# Patient Record
Sex: Female | Born: 1950 | Hispanic: No | Marital: Married | Smoking: Former smoker
Health system: Southern US, Community
[De-identification: ages and names within clinical notes are randomized; demographics above are authoritative.]

## PROBLEM LIST (undated history)

## (undated) DIAGNOSIS — T7840XA Allergy, unspecified, initial encounter: Secondary | ICD-10-CM

## (undated) DIAGNOSIS — K219 Gastro-esophageal reflux disease without esophagitis: Secondary | ICD-10-CM

## (undated) HISTORY — DX: Gastro-esophageal reflux disease without esophagitis: K21.9

## (undated) HISTORY — PX: OOPHORECTOMY: SHX86

## (undated) HISTORY — DX: Allergy, unspecified, initial encounter: T78.40XA

---

## 2007-06-04 ENCOUNTER — Encounter (INDEPENDENT_AMBULATORY_CARE_PROVIDER_SITE_OTHER): Payer: Self-pay | Admitting: Internal Medicine

## 2007-06-04 ENCOUNTER — Observation Stay (HOSPITAL_COMMUNITY): Admission: EM | Admit: 2007-06-04 | Discharge: 2007-06-05 | Payer: Self-pay | Admitting: Emergency Medicine

## 2007-06-04 ENCOUNTER — Ambulatory Visit: Payer: Self-pay | Admitting: Internal Medicine

## 2007-06-04 ENCOUNTER — Ambulatory Visit: Payer: Self-pay | Admitting: Surgery

## 2009-03-10 IMAGING — CR DG CHEST 1V PORT
1 series · 1 of 1 positions shown · non-contrast
Comparison: none

CLINICAL DATA: Chest pain

Portable chest at 1511:
No previous for comparison. The heart size and mediastinal contours are within
normal limits.  Both lungs are clear.  The visualized skeletal structures are
unremarkable.

[AP]
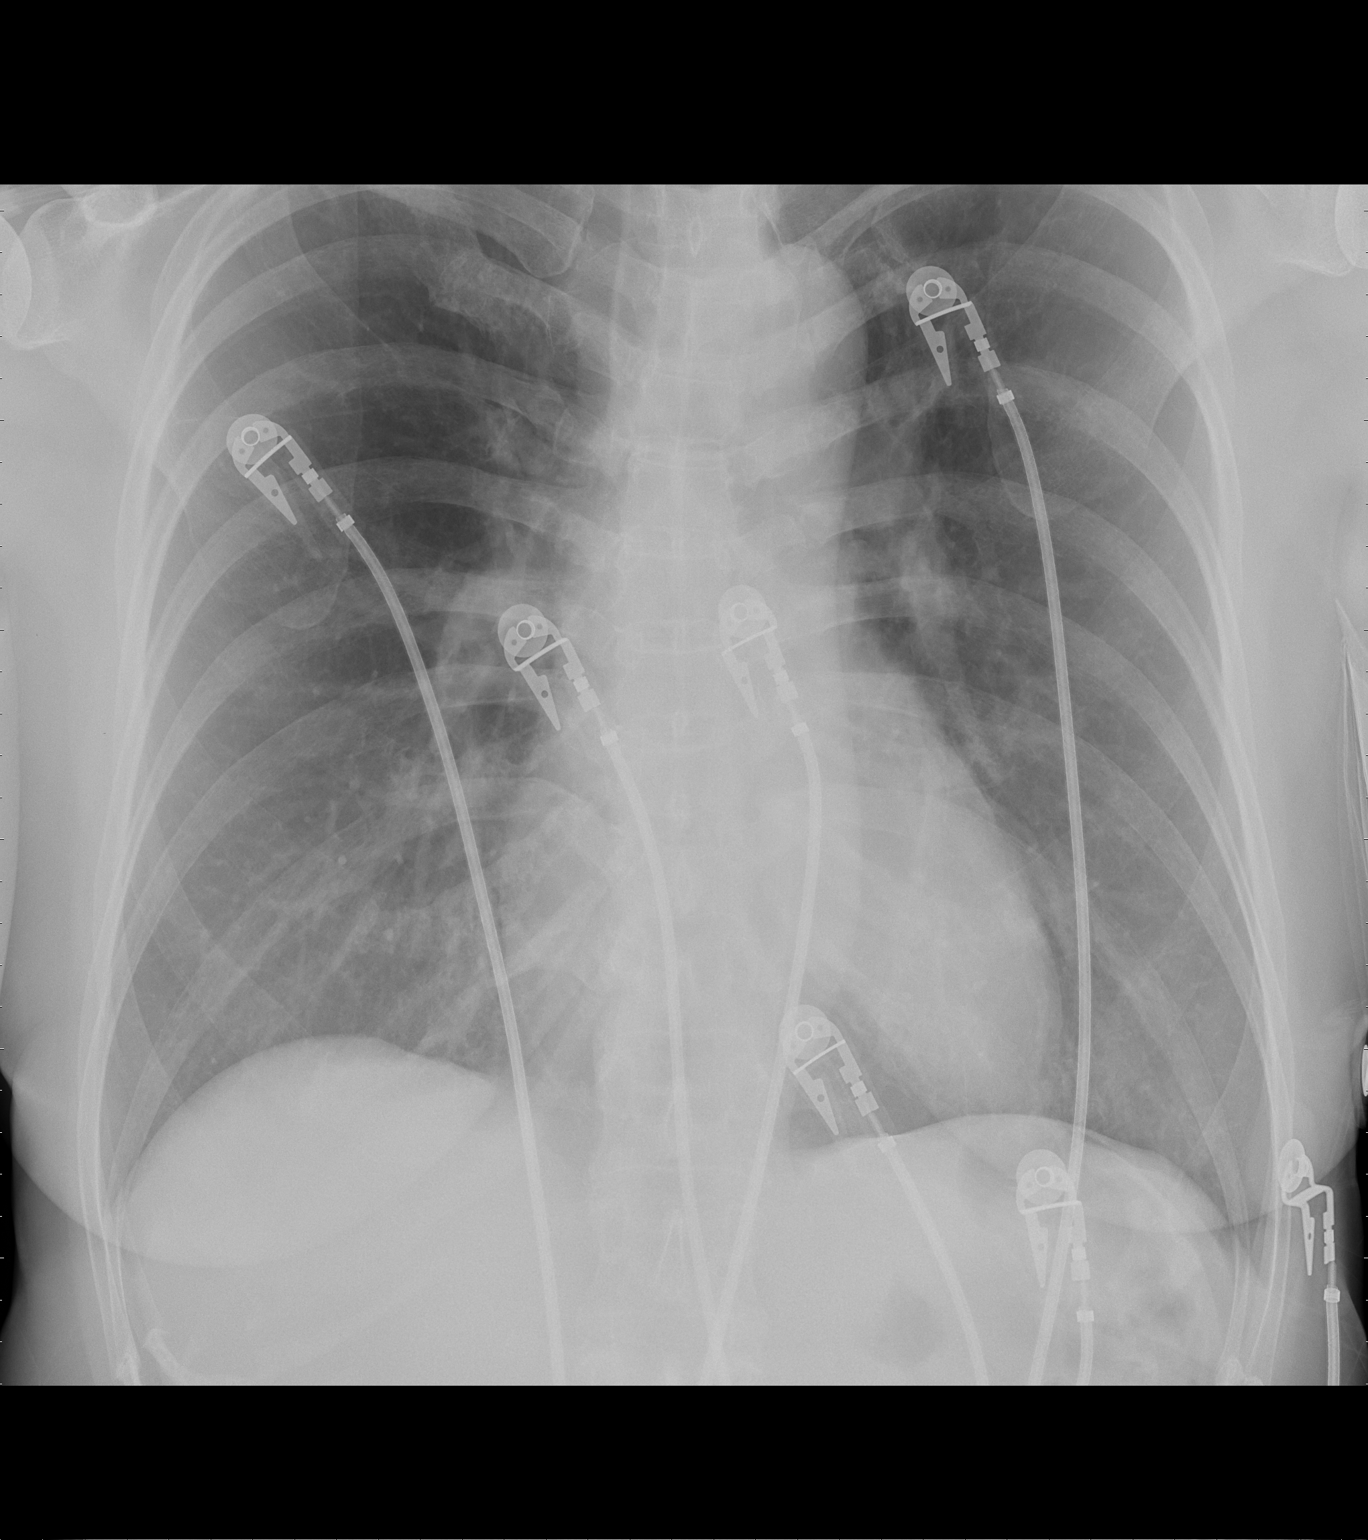

[1 of 1 positions shown; findings below may reference images not displayed]

IMPRESSION: 1. No active cardiopulmonary disease.

## 2010-08-05 NOTE — Consult Note (Signed)
NAME:  Kristen Young, Kristen Young NO.:  [id number]   MEDICAL RECORD NO.:  [id number]          PATIENT TYPE:  OBV   LOCATION:  3711                         FACILITY:  [facility]   PHYSICIAN:  Corky Crafts, MDDATE OF BIRTH:  [birth year]   DATE OF CONSULTATION:  DATE OF DISCHARGE:  06/05/2007                                 CONSULTATION   REFERRING PHYSICIAN:  Madaline Guthrie, M.D.   REASON FOR CONSULTATION:  Chest pain.   HISTORY OF PRESENT ILLNESS:  The patient is a 60 year old who is having  chest pain yesterday, while carrying objects at work.  It lasted about 2  hours.  She went to Urgent Care.  She got a nitroglycerin tablet under  her tongue and an aspirin, and it was relieved.  She had some more chest  pain last night while in the hospital, which was different in nature.  She did again take some nitroglycerin.  She exercises regularly in the  morning at home.  She does not have any chest pain with that.  Her pain  that she had this morning was worse with inspiration.  The most recent  pain was in her epigastric area.  She had some shortness of breath with  the initial episode of chest pain, but since then has not had any  problems.   PAST MEDICAL HISTORY:  1. GERD.  2. Seasonal allergies.  3. Migraines.   PAST SURGICAL HISTORY:  Ovarian surgery.   ALLERGIES:  NO KNOWN DRUG ALLERGIES.   SOCIAL HISTORY:  She smoked in the past.  She occasionally drinks  alcohol.  She works as a Child psychotherapist.  She lives with her husband and  daughter.   FAMILY HISTORY:  Mother died of an MI at age 11, her brother had a CVA  at age 80.   REVIEW OF SYSTEMS:  No fevers, chills, no weight loss, no dyspnea on  exertion, no palpitations, no bleeding problems, chest pain or shortness  of breath, as described as above well.  All other systems negative.   PHYSICAL EXAM:  VITAL SIGNS:  Blood pressure is 114/70, pulse 85.  GENERAL:  She is awake, alert, no apparent distress.  HEENT:  HEAD:   Normocephalic, atraumatic.  EYES:  Extraocular is intact.  NECK:  No JVD.  CARDIOVASCULAR:  Regular rate and rhythm, S1-S2.  LUNGS:  Clear to auscultation bilaterally.  ABDOMEN:  Mild epigastric tenderness with palpation.  No rebound, no  guarding.  EXTREMITIES:  Showed no edema.  NEURO:  No focal deficits.  Normal gait.  SKIN:  No rash.  BACK:  No kyphosis.   ECG showed normal sinus rhythm with no pathologic Q-waves, nonspecific  ST-T wave changes in the anterior precordium.   MEDICAL DECISION-MAKING:  A 60 year old with atypical chest pain.   PLAN:  She is ruled out for MI.  I would perform a stress test.  The  patient wants to go home and have the test as an outpatient.  This is  reasonable.  In the interim, I have instructed her to take her Zantac  regularly, will give her a prescription for  sublingual nitroglycerin as  needed.  I will have our office contact her with a time for her stress  test tomorrow.      Corky Crafts, MD  Electronically Signed     JSV/MEDQ  D:  06/05/2007  T:  06/05/2007  Job:  [id number]

## 2010-08-08 NOTE — Discharge Summary (Signed)
NAME:  Kristen Young, Kristen Young             ACCOUNT NO.:  [id number]   MEDICAL RECORD NO.:  [id number]          PATIENT TYPE:  OBV   LOCATION:  3711                         FACILITY:  [facility]   PHYSICIAN:  Madaline Guthrie, M.D.    DATE OF BIRTH:  May 02, 1950   DATE OF ADMISSION:  06/04/2007  DATE OF DISCHARGE:  06/05/2007                               DISCHARGE SUMMARY   DISCHARGE DIAGNOSES:  1. Chest pain.  2. Left lower extremity pain.  3. Gastroesophageal reflux disease.  4. Seasonal allergies.   DISCHARGE MEDICATIONS:  1. Nitroglycerin 0.4 mg sublingual q.5 minutes x3 p.r.n. for chest      pain.  2. Women's One A Day.  3. Motrin 400 mg q.8 hours p.r.n.  4. Zantac 150 mg daily.   DISPOSITION:  The patient is to follow up with Corky Crafts, M.D.  in cardiology for a possible stress test.  The patient is also to follow  up with Joaquin Courts, M.D. in the Outpatient Clinic and both  physicians offices will call her with those appointments.   PROCEDURE:  No procedures were performed during this hospitalization.   CONSULTATIONS:  Cardiology was consulted and Dr. Eldridge Dace saw the  patient and recommended that she follow up in his office as an  outpatient secondary to chest pain.   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  She described the pain as  pressure-like and it radiated toward her biceps in her left arm.  It was  not associated with nausea, vomiting, or diaphoresis, and was not  relieved by rest.  The pain was relieved with nitroglycerin in the  emergency room.  She denies any fevers, chills, and cough.  She notes  that she recently traveled to the [place] a [place] ago and also has  some left lower extremity pain that is chronic, but has been worsening  lately.   She denies any history of diabetes, hypertension,  hyperlipidemia, premature family history of coronary artery disease.   ADMISSION VITALS:  Temperature 98.4, blood pressure 134/67, pulse 61,  respiratory rate 22, O2 saturation 97% on room air.   PHYSICAL EXAMINATION:  GENERAL:  She is alert and pleasant, well  dressed, and in no distress.  HEENT:  Eyes; anicteric.  ENT; moist mucous membranes.  NECK:  No JVD, no lymphadenopathy.  LUNGS:  Clear to auscultation bilaterally with a normal respiratory  effort and good air movement.  CARDIOVASCULAR:  Regular rate and rhythm, no murmurs, rubs, or gallops.  ABDOMEN:  Good bowel sounds, soft, nontender, and nondistended with a  well-healed vertical incision.  EXTREMITIES:  No edema, palpable cords, or erythema with 2+ dorsalis  pedis pulses.  Homan's sign positive.  SKIN:  Warm with no rashes.  MUSCULOSKELETAL:  Strength 5/5.  NEUROLOGY:  No focal deficits.   ADMISSION LABS:  Sodium 132, potassium 3.6, chloride 98, bicarb 27, BUN  10, creatinine  0.55, glucose 95.  LFTs all within normal limits.  Calcium 8.9, white count 7.2 with an absolute neutrophil count of 3.6,  hemoglobin 13.3 with MCV 88, platelets 275.  Cardiac enzymes with point  of care marker CK-MB less than 1, troponin less than 0.05, myoglobin 43.  Lipase 28.  D-dimer less than 0.22.  Urine drug screen negative.  TSH  0.939.  Hemoglobin A1C 6.0.   Chest x-ray; no active cardiopulmonary disease.   EKG;  T wave inversion in V1 and V3 and some flattening in V2.  Normal  sinus rhythm.  No ST segment elevation or depression.   HOSPITAL COURSE:  Problem 1.  Chest pain.  The patient was monitored on  telemetry without any arrhythmias.  Her cardiac enzymes were cycled and  were all well within normal limits.  EKGs were cycled and there were no  changes in her T wave inversion in V1 and V3 and flattening in V2.  Given the patient only risk factor for coronary artery disease is  remote  smoking history of approximately less than a pack a day for five years,  it was felt like she could go home safely and this pain was likely not  cardiac in etiology.  However, cardiology was consulted and saw the  patient and recommended possible outpatient stress test and also  recommended the patient to take her Zantac regularly.  The patient's  hemoglobin A1C was checked and was within normal limits.  A D-dimer was  checked and was well within normal limits and a TSH was checked and was  within normal limits.  The patient was given sublingual nitroglycerin to  take for pain relief and if her pain got any worse was instructed to  return to the emergency room.   Problem 2.  Left lower extremity pain.  The patient reported significant  left lower extremity pain and given her history of a very long flight  approximately a month ago, a left lower extremity Doppler was done and  showed no evidence of DVT.  The patient continued to have some left  lower extremity pain during this hospitalization that was relieved with  low dose of ibuprofen and she will continue this as an outpatient.  She  was not taking significant doses of NSAIDS as an outpatient and so it  was not felt like her NSAIDS ingestion was a problem and could relate to  #1.   Problem 3.  GERD.  It was unclear whether her chest pain was related to  her GERD, but PPI therapy was started while inpatient and she will  continue to take her Zantac as an outpatient regularly.   DISCHARGE LABORATORY DATA:  Lipid panel; total cholesterol 163, HDL 53,  LDL 83, triglycerides 136.  BMET; sodium 140, potassium 4.1, chloride  105, bicarb 26, BUN 12, creatinine 0.68, glucose 90, calcium 9.1.  CBC;  white count 6.3, hemoglobin 12.8, MCV 88, and platelets 257.  TSH 0.939.  Hemoglobin A1C 6.0. Urine drug screen negative.  D-dimer less than 0.22.   DISCHARGE VITALS:  Temperature 97.9, blood pressure 123/80, pulse 94,  respiratory rate  18, O2 saturation 98% on room air.      Joaquin Courts, MD  Electronically Signed      Madaline Guthrie, M.D.  Electronically Signed    VW/MEDQ  D:  06/07/2007  T:  06/07/2007  Job:  [id number]   cc:   Corky Crafts, MD

## 2010-12-15 LAB — CARDIAC PANEL(CRET KIN+CKTOT+MB+TROPI)
CK, MB: 0.9
CK, MB: 1.1
Relative Index: 0.8
Total CK: 143
Troponin I: <0.01

## 2010-12-15 LAB — BASIC METABOLIC PANEL
Calcium: 9.1
Creatinine, Ser: 0.68
GFR calc Af Amer: >60
GFR calc non Af Amer: >60
Glucose, Bld: 90
Sodium: 140

## 2010-12-15 LAB — POCT CARDIAC MARKERS
CKMB, poc: <1 — ABNORMAL LOW
CKMB, poc: <1 — ABNORMAL LOW
Myoglobin, poc: 43.7
Myoglobin, poc: 47.8
Operator id: 294521
Troponin i, poc: <0.05
Troponin i, poc: <0.05

## 2010-12-15 LAB — COMPREHENSIVE METABOLIC PANEL
Albumin: 3.8
Alkaline Phosphatase: 61
BUN: 10
Calcium: 8.9
Potassium: 3.6
Total Protein: 6.9

## 2010-12-15 LAB — CBC
HCT: 39.5
Hemoglobin: 12.8
Platelets: 275
RBC: 4.29
RDW: 14.3
RDW: 14

## 2010-12-15 LAB — DIFFERENTIAL
Basophils Relative: 1
Lymphocytes Relative: 34
Lymphs Abs: 2.5
Monocytes Absolute: 0.6
Monocytes Relative: 9
Neutro Abs: 3.6

## 2010-12-15 LAB — TSH: TSH: 0.939

## 2010-12-15 LAB — LIPID PANEL
Cholesterol: 163
LDL Cholesterol: 83
Triglycerides: 136
VLDL: 27

## 2010-12-15 LAB — CK TOTAL AND CKMB (NOT AT ARMC)
CK, MB: 1.3
Total CK: 157

## 2010-12-15 LAB — RAPID URINE DRUG SCREEN, HOSP PERFORMED: Barbiturates: NOT DETECTED

## 2015-06-25 ENCOUNTER — Telehealth: Payer: Self-pay

## 2015-06-25 NOTE — Telephone Encounter (Signed)
Called patient to review information for chart and reming of scheduled appointment for tomorrow. Left message for return call.

## 2015-06-26 ENCOUNTER — Encounter: Payer: Self-pay | Admitting: Medical

## 2015-06-26 ENCOUNTER — Ambulatory Visit (HOSPITAL_BASED_OUTPATIENT_CLINIC_OR_DEPARTMENT_OTHER)
Admission: RE | Admit: 2015-06-26 | Discharge: 2015-06-26 | Disposition: A | Discharge status: Home / Self Care | Payer: Commercial Managed Care - PPO | Source: Ambulatory Visit | Attending: Medical | Admitting: Medical

## 2015-06-26 ENCOUNTER — Ambulatory Visit (INDEPENDENT_AMBULATORY_CARE_PROVIDER_SITE_OTHER): Payer: Commercial Managed Care - PPO | Admitting: Medical

## 2015-06-26 VITALS — BP 118/78 | HR 87 | Temp 98.1°F | Ht 60.0 in | Wt 128.2 lb

## 2015-06-26 DIAGNOSIS — M545 Low back pain: Secondary | ICD-10-CM

## 2015-06-26 DIAGNOSIS — M47896 Other spondylosis, lumbar region: Secondary | ICD-10-CM | POA: Insufficient documentation

## 2015-06-26 DIAGNOSIS — M8580 Other specified disorders of bone density and structure, unspecified site: Secondary | ICD-10-CM

## 2015-06-26 MED ORDER — CYCLOBENZAPRINE HCL 5 MG PO TABS: 5.0000 mg | ORAL_TABLET | Freq: Every day | ORAL | Status: AC

## 2015-06-26 MED ORDER — LEVOCETIRIZINE DIHYDROCHLORIDE 5 MG PO TABS: 5.0000 mg | ORAL_TABLET | Freq: Every evening | ORAL | Status: DC

## 2015-06-26 MED ORDER — DICLOFENAC SODIUM 75 MG PO TBEC: 75.0000 mg | DELAYED_RELEASE_TABLET | Freq: Two times a day (BID) | ORAL | Status: AC

## 2015-06-26 NOTE — Progress Notes (Signed)
Pre visit review using our clinic review tool, if applicable. No additional management support is needed unless otherwise documented below in the visit note. 

## 2015-06-26 NOTE — Progress Notes (Signed)
Subjective:    Patient ID: Kristen Young, female    DOB: October 13, 1950, 65 y.o.   MRN: [id number]  HPI   I have reviewed pt PMH, PSH, FH, Social History and Surgical History  Works at [employer], pt does not exercise officially, no caffeine beverage, pt states eats healthy, married.  Pt in for first time. She has lower back pain. Pt has pain for 3-4 weeks. No fall or injury. Pain came on slowly. No history of back pain.  Hurts all the time to some degree. When stands will be more painful.   No pain radiating to legs. No saddle anesthesia. No incontinence.  Pt also has spring allergie. Pt uses benadryl.  Pt behind on routine maintenance. So advised to schedule CPE.  Review of Systems  Constitutional: Negative for fever, chills and fatigue.  HENT:       Spring allergies. Relativley controlled now.  Respiratory: Negative for cough, chest tightness, shortness of breath and wheezing.   Cardiovascular: Negative for chest pain and palpitations.  Gastrointestinal: Negative for abdominal pain.  Genitourinary: Negative for dysuria, frequency, flank pain and difficulty urinating.  Musculoskeletal: Positive for back pain. Negative for myalgias, joint swelling, arthralgias and neck stiffness.  Skin: Negative for rash.  Hematological: Negative for adenopathy. Does not bruise/bleed easily.  Psychiatric/Behavioral: Negative for behavioral problems and confusion.    No past medical history on file.  Social History   Social History  . Marital Status: Married    Spouse Name: N/A  . Number of Children: N/A  . Years of Education: N/A   Occupational History  . Not on file.   Social History Main Topics  . Smoking status: Never Smoker   . Smokeless tobacco: Never Used  . Alcohol Use: No  . Drug Use: No  . Sexual Activity: Not on file   Other Topics Concern  . Not on file   Social History Narrative  . No narrative on file    No past surgical history on file.  No family  history on file.  Allergies  Allergen Reactions  . Dextromethorphan-Guaifenesin   . Guaifenesin Rash    No current outpatient prescriptions on file prior to visit.   No current facility-administered medications on file prior to visit.    BP 118/78 mmHg  Pulse 87  Temp(Src) 98.1 F (36.7 C) (Oral)  Ht 5' (1.524 m)  Wt 128 lb 3.2 oz (58.151 kg)  BMI 25.04 kg/m2  SpO2 98%  LMP        Objective:   Physical Exam  General Appearance- Not in acute distress.    Chest and Lung Exam Auscultation: Breath sounds:-Normal. Clear even and unlabored. Adventitious sounds:- No Adventitious sounds.  Cardiovascular Auscultation:Rythm - Regular, rate and rythm. Heart Sounds -Normal heart sounds.  Abdomen Inspection:-Inspection Normal.  Palpation/Perucssion: Palpation and Percussion of the abdomen reveal- Non Tender, No Rebound tenderness, No rigidity(Guarding) and No Palpable abdominal masses.  Liver:-Normal.  Spleen:- Normal.   Back Mid lumbar spine tenderness to palpation. Pain on straight leg lift rt side leg lift. Pain on lateral movements and flexion/extension of the spine.  Lower ext neurologic  L5-S1 sensation intact bilaterally. Normal patellar reflexes bilaterally. No foot drop bilaterally.      Assessment & Plan:  For your back pain will get lumbar xray today. Back stretching exercises. If pain persist consider PT. If pain worsens or radiating consider MRI.  Rx diclofenac for pain.  Rx cyclobenzaprine low dose at night  For allergies  rx xyzal. Stop benadryl.  Follow up in 10-14 days or as needed

## 2015-06-26 NOTE — Patient Instructions (Addendum)
For your back pain will get lumbar xray today. Back stretching exercises. If pain persist consider PT. If pain worsens or radiating consider MRI.  Red flag signs and symptoms reviewed that would indicate ED evaluate.   Rx diclofenac for pain.  Rx cyclobenzaprine low dose at night  For allergies rx xyzal. Stop benadryl.  Follow up in 10-14 days or as needed.  Asked pt to schedule cpe since she has not had one done in very long time and need to update health maintenance.

## 2015-06-28 NOTE — Addendum Note (Signed)
Addended by: Neldon LabellaMABE, HOLDEN S on: 06/28/2015 02:19 PM   Modules accepted: Orders

## 2015-07-09 ENCOUNTER — Encounter: Payer: Self-pay | Admitting: Medical

## 2015-07-09 ENCOUNTER — Telehealth: Payer: Self-pay | Admitting: Medical

## 2015-07-09 ENCOUNTER — Ambulatory Visit (INDEPENDENT_AMBULATORY_CARE_PROVIDER_SITE_OTHER): Payer: Commercial Managed Care - PPO | Admitting: Medical

## 2015-07-09 VITALS — BP 122/80 | HR 67 | Temp 98.1°F | Ht 60.0 in | Wt 129.6 lb

## 2015-07-09 DIAGNOSIS — J309 Allergic rhinitis, unspecified: Secondary | ICD-10-CM | POA: Diagnosis not present

## 2015-07-09 DIAGNOSIS — J0100 Acute maxillary sinusitis, unspecified: Secondary | ICD-10-CM

## 2015-07-09 DIAGNOSIS — M545 Low back pain: Secondary | ICD-10-CM | POA: Diagnosis not present

## 2015-07-09 DIAGNOSIS — E559 Vitamin D deficiency, unspecified: Secondary | ICD-10-CM

## 2015-07-09 DIAGNOSIS — M8580 Other specified disorders of bone density and structure, unspecified site: Secondary | ICD-10-CM | POA: Diagnosis not present

## 2015-07-09 DIAGNOSIS — R062 Wheezing: Secondary | ICD-10-CM

## 2015-07-09 LAB — VITAMIN D 25 HYDROXY (VIT D DEFICIENCY, FRACTURES): VITD: 26.76 ng/mL — AB (ref 30.00–100.00)

## 2015-07-09 MED ORDER — CEFDINIR 300 MG PO CAPS: 300.0000 mg | ORAL_CAPSULE | Freq: Two times a day (BID) | ORAL | Status: AC

## 2015-07-09 MED ORDER — VITAMIN D (ERGOCALCIFEROL) 1.25 MG (50000 UNIT) PO CAPS: 50000.0000 [IU] | ORAL_CAPSULE | ORAL | Status: AC

## 2015-07-09 MED ORDER — FLUTICASONE PROPIONATE 50 MCG/ACT NA SUSP: 2.0000 | Freq: Every day | NASAL | Status: AC

## 2015-07-09 MED ORDER — BENZONATATE 100 MG PO CAPS: 100.0000 mg | ORAL_CAPSULE | Freq: Three times a day (TID) | ORAL | Status: AC | PRN

## 2015-07-09 MED ORDER — ALBUTEROL SULFATE HFA 108 (90 BASE) MCG/ACT IN AERS: 2.0000 | INHALATION_SPRAY | Freq: Four times a day (QID) | RESPIRATORY_TRACT | Status: AC | PRN

## 2015-07-09 NOTE — Progress Notes (Signed)
Pre visit review using our clinic review tool, if applicable. No additional management support is needed unless otherwise documented below in the visit note. 

## 2015-07-09 NOTE — Patient Instructions (Addendum)
For allergic rhinitis continue xyzal. Rx flonase. Will give depomedrol 80 mg im today due to severity of symptoms.  For mild transient wheeze. Will rx albuterol.  If sinus pressure persists despite the above then start cefdnir.  For osteopenia get vitamin D level today. Start calcium 1200 mg a day. Get dexascan when notified on when scheduled.  For low back pain can continue diclofenac on occasion.  Follow up in 7-10 days or as needed

## 2015-07-09 NOTE — Progress Notes (Signed)
Subjective:    Patient ID: Kristen Young, female    DOB: 1950/11/22, 65 y.o.   MRN: [id number]  HPI  Pt in for follow up on her back pain. Pt responded to diclofenac and flexeril. 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 Also some osteopenia. She took diclofenac and now has no pain.  Pt has order for vitamin D. She can get today. I also placed order for dexascan. Pt has not been contacted yet on date of scan.  Pt has some allergies. Some mild on last visit. I rx'd xyzal. Did not help. She has sneezing, itchy eyes, runny nose and pnd. Also some sinus pressure. Symptoms have gotten worse over last 2 weeks. Pt states [place] Exposed to a lot of pollen.      Review of Systems  Constitutional: Negative for fever, chills and fatigue.  HENT: Positive for congestion, postnasal drip, rhinorrhea, sinus pressure and sneezing. Negative for ear pain and mouth sores.   Eyes: Positive for itching.  Respiratory: Positive for wheezing. Negative for cough, chest tightness and shortness of breath.        Occasional wheezing.  Cardiovascular: Negative for chest pain and palpitations.  Gastrointestinal: Negative for abdominal pain.  Musculoskeletal: Negative for back pain.       See hpi.  Neurological: Negative for dizziness and headaches.  Hematological: Negative for adenopathy. Does not bruise/bleed easily.   Past Medical History  Diagnosis Date  . Allergy   . GERD (gastroesophageal reflux disease)      Social History   Social History  . Marital Status: Married    Spouse Name: N/A  . Number of Children: N/A  . Years of Education: N/A   Occupational History  . Not on file.   Social History Main Topics  . Smoking status: Former Smoker    Quit date: 06/25/1968  . Smokeless tobacco: Never Used  . Alcohol Use: 0.0 oz/week    0 Standard drinks or equivalent per week     Comment: occasional/rare cocktail.  . Drug Use: No  . Sexual Activity:  Not on file   Other Topics Concern  . Not on file   Social History Narrative    Past Surgical History  Procedure Laterality Date  . Oophorectomy      pt not sure which side.     Family History  Problem Relation Age of Onset  . Hypertension Mother   . Stroke Sister   . Stroke Brother   . Diabetes Brother     Allergies  Allergen Reactions  . Dextromethorphan-Guaifenesin   . Guaifenesin Rash    Current Outpatient Prescriptions on File Prior to Visit  Medication Sig Dispense Refill  . cyclobenzaprine (FLEXERIL) 5 MG tablet Take 1 tablet (5 mg total) by mouth at bedtime. 30 tablet 0  . diclofenac (VOLTAREN) 75 MG EC tablet Take 1 tablet (75 mg total) by mouth 2 (two) times daily. 20 tablet 0   No current facility-administered medications on file prior to visit.    BP 122/80 mmHg  Pulse 67  Temp(Src) 98.1 F (36.7 C) (Oral)  Ht 5' (1.524 m)  Wt 129 lb 9.6 oz (58.786 kg)  BMI 25.31 kg/m2  SpO2 98%       Objective:   Physical Exam  General  Mental Status - Alert. General Appearance - Well groomed. Not in acute distress.  Skin Rashes- No Rashes.  HEENT Head- Normal. Ear Auditory  Canal - Left- Normal. Right - Normal.Tympanic Membrane- Left- Normal. Right- Normal. Eye Sclera/Conjunctiva- both eyes red. Lower lids mild inflamed. Nose & Sinuses Nasal Mucosa- Left-  Boggy and Congested. Right-  Boggy and  Congested.Bilateral maxillary and frontal sinus pressure. Mouth & Throat Lips: Upper Lip- Normal: no dryness, cracking, pallor, cyanosis, or vesicular eruption. Lower Lip-Normal: no dryness, cracking, pallor, cyanosis or vesicular eruption. Buccal Mucosa- Bilateral- No Aphthous ulcers. Oropharynx- No Discharge or Erythema. +pnd Tonsils: Characteristics- Bilateral- No Erythema or Congestion. Size/Enlargement- Bilateral- No enlargement. Discharge- bilateral-None.  Neck Neck- Supple. No Masses.   Chest and Lung Exam Auscultation: Breath Sounds:-Clear even  and unlabored. Only faint occasional expiratory wheeze  Cardiovascular Auscultation:Rythm- Regular, rate and rhythm. Murmurs & Other Heart Sounds:Ausculatation of the heart reveal- No Murmurs.  Lymphatic Head & Neck General Head & Neck Lymphatics: Bilateral: Description- No Localized lymphadenopathy.  Back Mid lumbar spine tenderness to palpation. Pain on straight leg lift. Pain on lateral movements and flexion/extension of the spine.  Lower ext neurologic  L5-S1 sensation intact bilaterally. Normal patellar reflexes bilaterally. No foot drop bilaterally.      Assessment & Plan:  For allergic rhinitis continue xyzal. Rx flonase. Will give depmedrol 80 mg im today due to severity of symptoms.  If sinus pressure persists despite the above then start cefdnir.  For osteopenia get vitamin D level today. Start calcium 1200 mg a day. Get dexascan when notified on when scheduled.  For low back pain can continue diclofenac on occasion.  Follow up in 7-10 days or as needed

## 2015-07-09 NOTE — Telephone Encounter (Signed)
Pt vitamin d sent to pharmacy.

## 2015-07-10 NOTE — Addendum Note (Signed)
Addended by: Neldon LabellaMABE, HOLDEN S on: 07/10/2015 09:54 AM   Modules accepted: Orders

## 2017-04-01 IMAGING — DX DG LUMBAR SPINE 2-3V
3 series · 3 of 3 positions shown · non-contrast
Comparison: No recent prior.

CLINICAL DATA: Low back pain.  Sciatica.

EXAM:
LUMBAR SPINE - 2-3 VIEW

[l-spine ap]
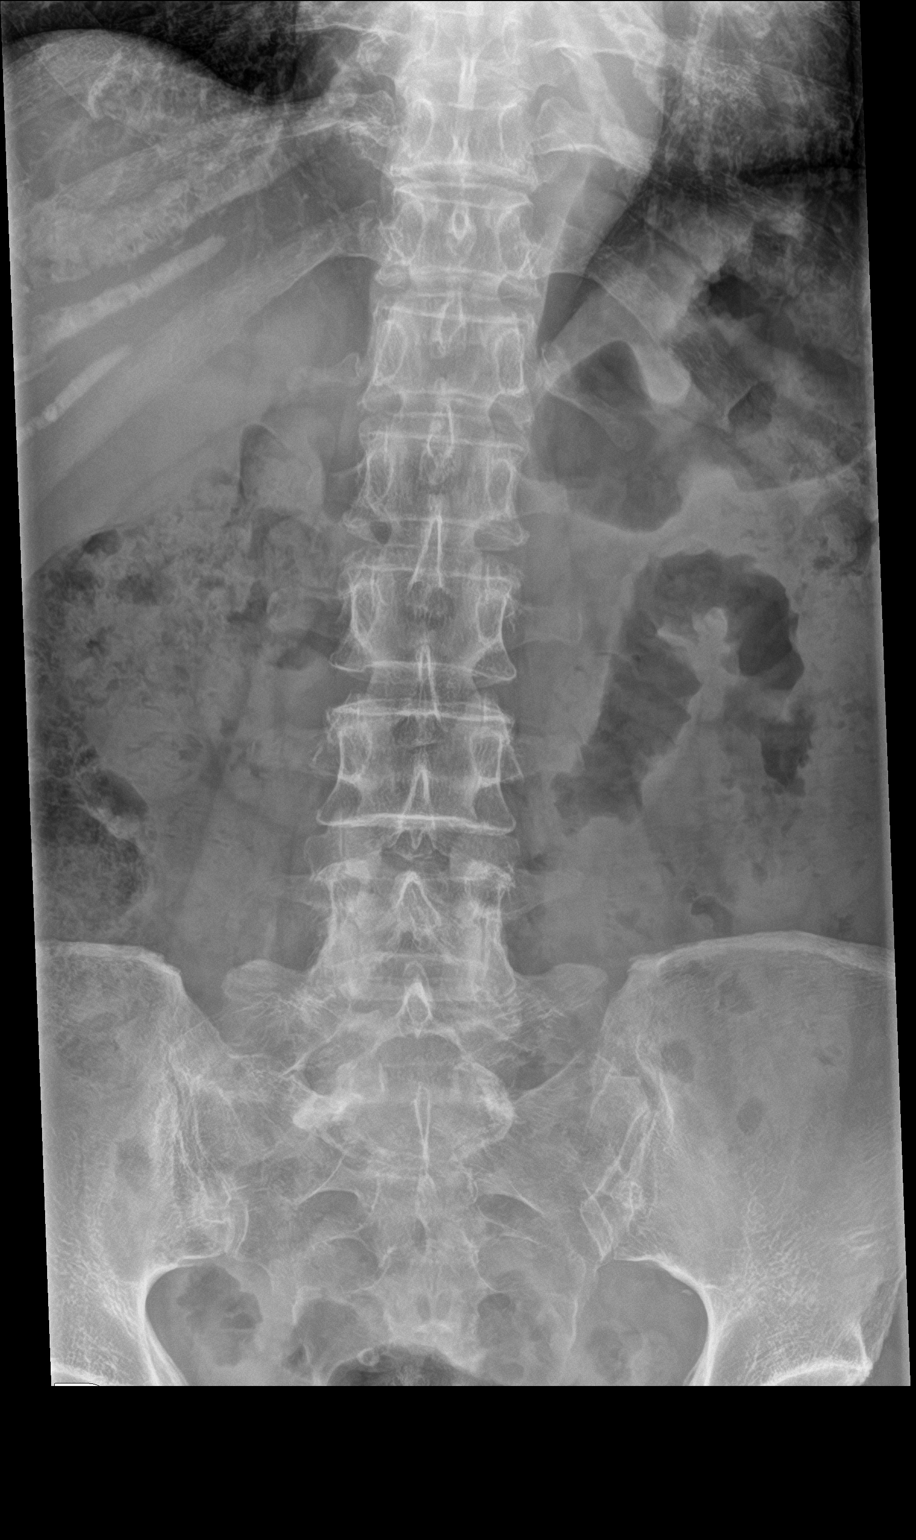

[l-spine lat]
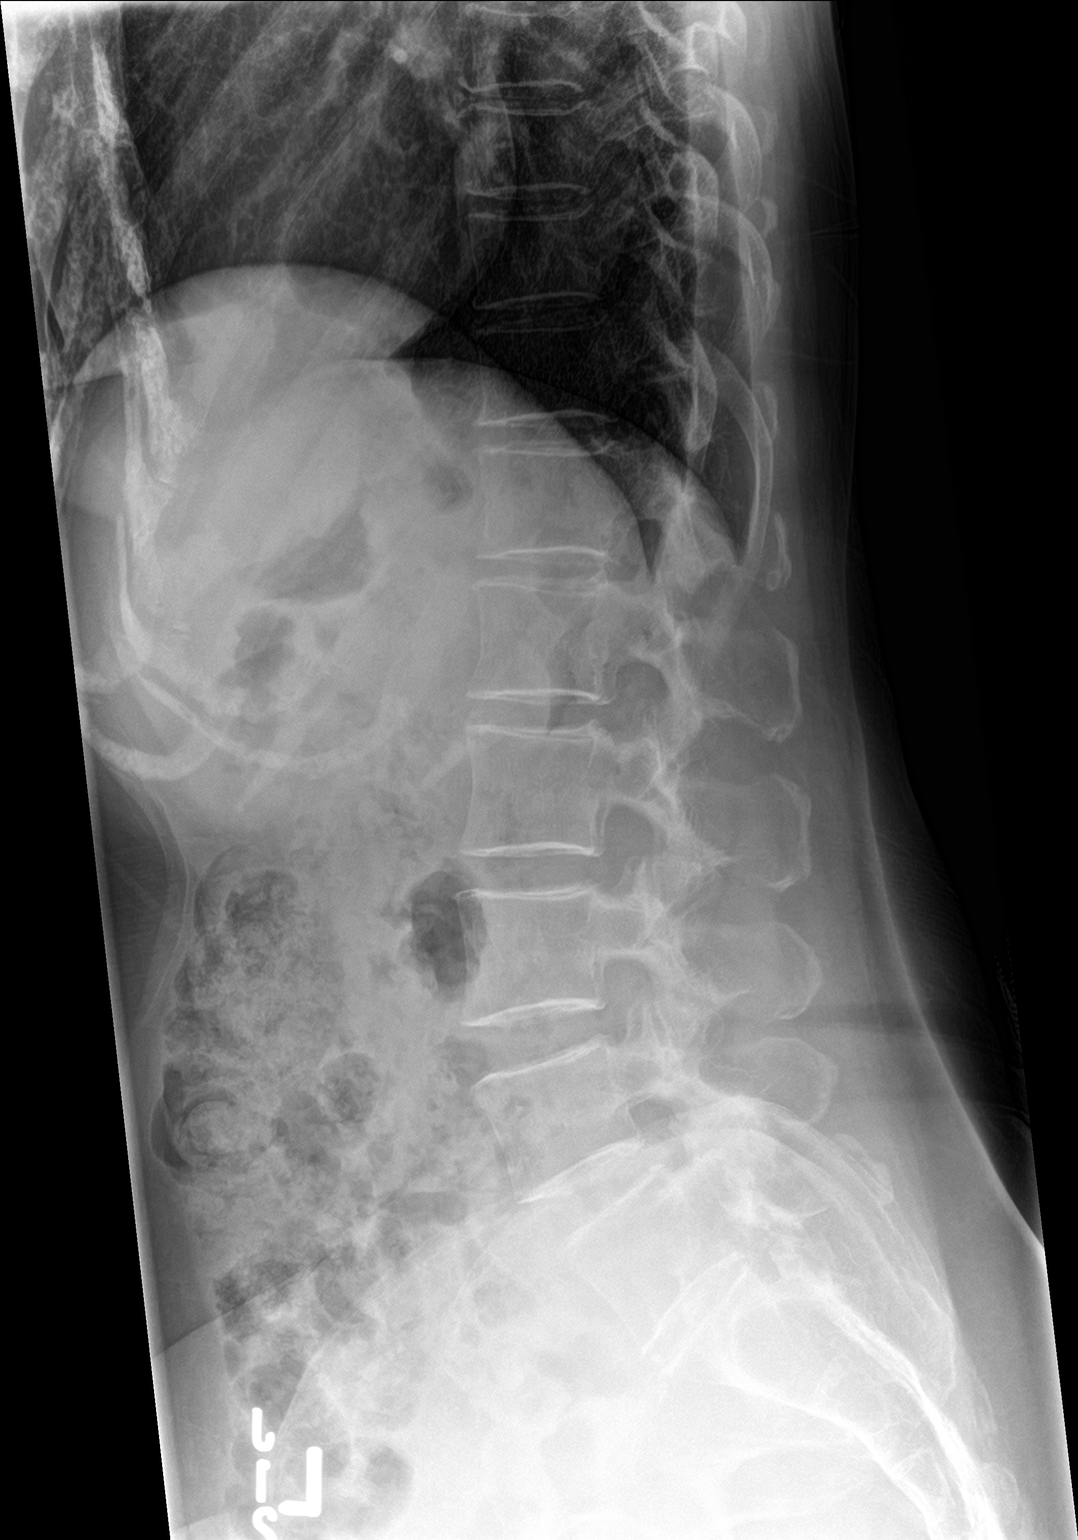

[l-spine spot]
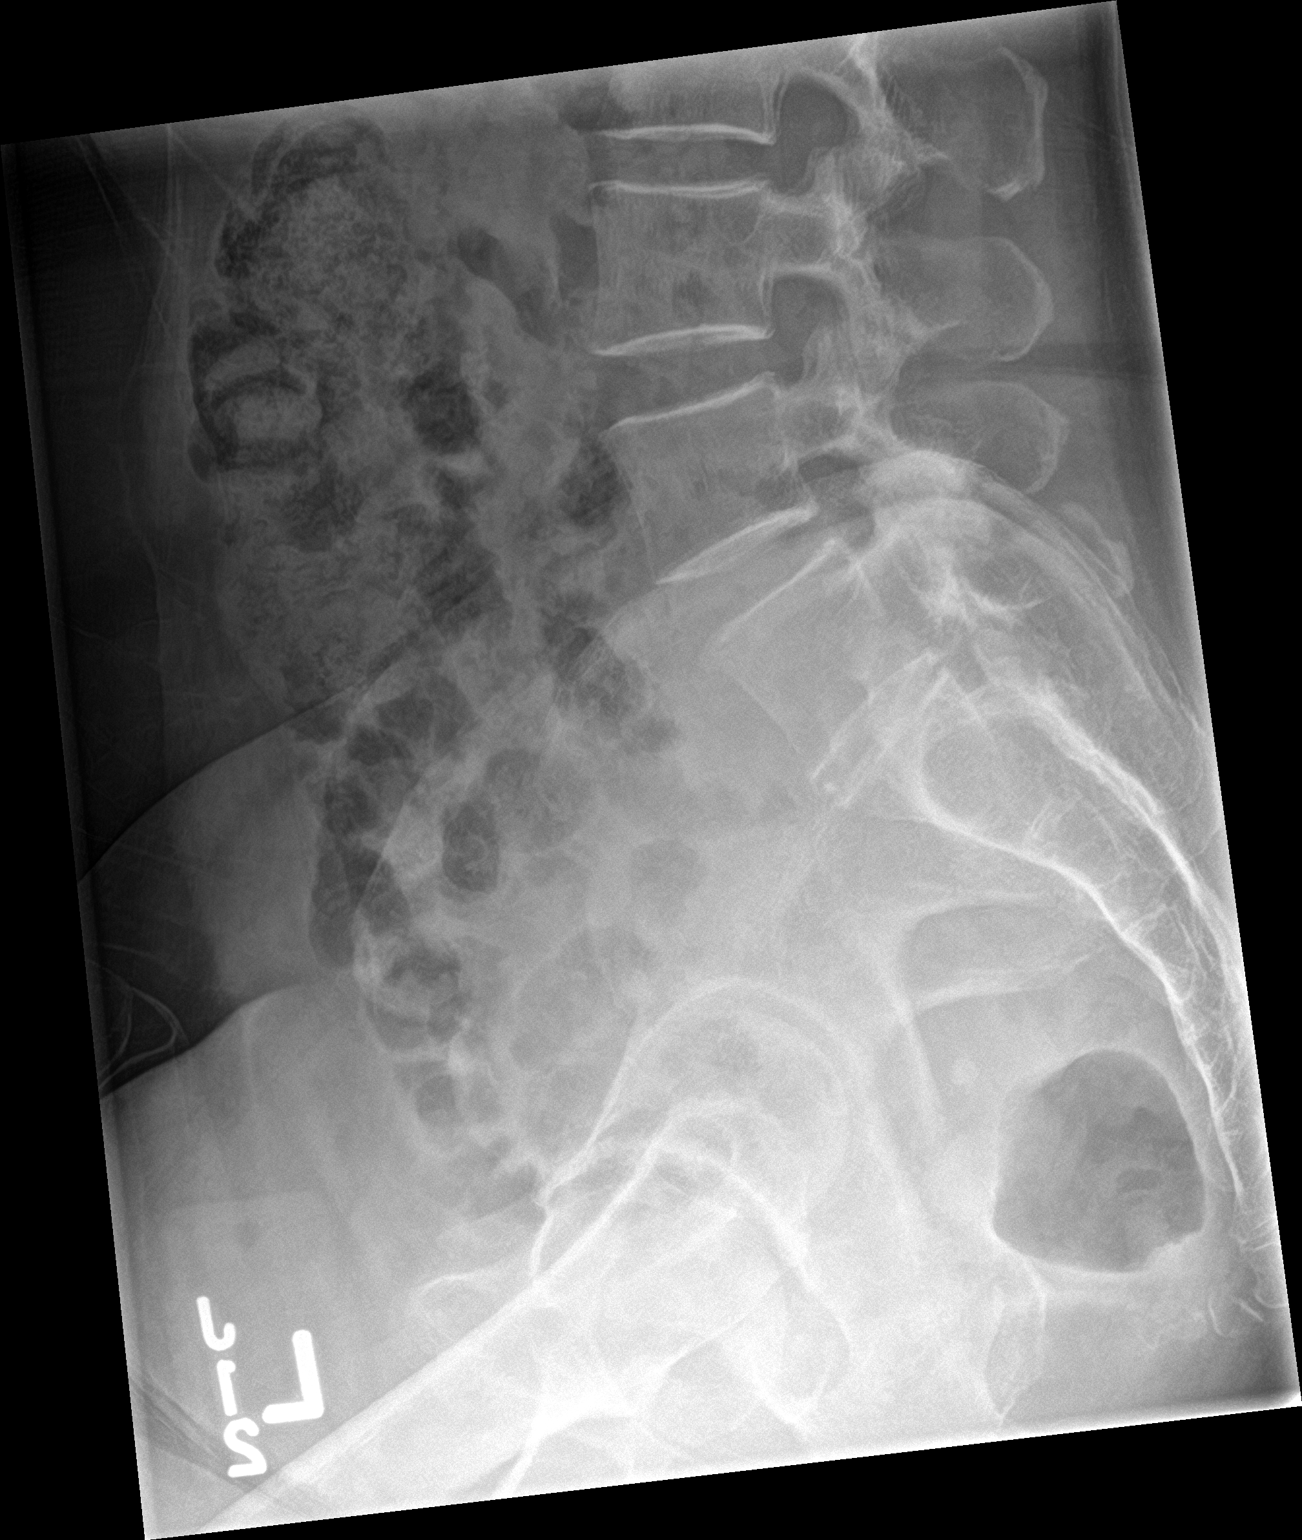

[3 of 3 positions shown; findings below may reference images not displayed]

FINDINGS: Probable transitional anatomy. No acute bony abnormality identified.
Mild diffuse degenerative change. Normal alignment.
IMPRESSION: Mild diffuse degenerative change. Diffuse osteopenia. No acute
abnormality.

## 2018-06-28 ENCOUNTER — Telehealth: Payer: Self-pay | Admitting: Medical

## 2018-06-28 NOTE — Telephone Encounter (Signed)
On review of charts, it has been about 3 years since pt last seen. She has hx of allergies and had been on albuterol in past. Notify of virual visits if needed

## 2018-06-29 NOTE — Telephone Encounter (Signed)
Called pt twice line is busy.

## 2021-02-20 DEATH — deceased
